# Patient Record
Sex: Male | Born: 1975 | Race: White | Hispanic: No | Marital: Married | Smoking: Current every day smoker
Health system: Southern US, Community
[De-identification: ages and names within clinical notes are randomized; demographics above are authoritative.]

## PROBLEM LIST (undated history)

## (undated) DIAGNOSIS — I519 Heart disease, unspecified: Secondary | ICD-10-CM

## (undated) DIAGNOSIS — E785 Hyperlipidemia, unspecified: Secondary | ICD-10-CM

## (undated) DIAGNOSIS — I10 Essential (primary) hypertension: Secondary | ICD-10-CM

## (undated) HISTORY — DX: Hyperlipidemia, unspecified: E78.5

## (undated) HISTORY — DX: Heart disease, unspecified: I51.9

## (undated) HISTORY — DX: Essential (primary) hypertension: I10

---

## 2018-09-25 HISTORY — PX: CORONARY ARTERY BYPASS GRAFT: SHX141

## 2020-03-26 HISTORY — PX: OTHER SURGICAL HISTORY: SHX169

## 2020-08-20 ENCOUNTER — Ambulatory Visit (INDEPENDENT_AMBULATORY_CARE_PROVIDER_SITE_OTHER): Payer: PRIVATE HEALTH INSURANCE | Admitting: Family Medicine

## 2020-08-20 ENCOUNTER — Other Ambulatory Visit: Payer: Self-pay

## 2020-08-20 ENCOUNTER — Encounter: Payer: Self-pay | Admitting: Family Medicine

## 2020-08-20 VITALS — BP 142/52 | HR 67 | Ht 74.0 in | Wt 240.0 lb

## 2020-08-20 DIAGNOSIS — M51369 Other intervertebral disc degeneration, lumbar region without mention of lumbar back pain or lower extremity pain: Secondary | ICD-10-CM | POA: Insufficient documentation

## 2020-08-20 DIAGNOSIS — G6289 Other specified polyneuropathies: Secondary | ICD-10-CM

## 2020-08-20 DIAGNOSIS — I10 Essential (primary) hypertension: Secondary | ICD-10-CM | POA: Diagnosis not present

## 2020-08-20 DIAGNOSIS — I2510 Atherosclerotic heart disease of native coronary artery without angina pectoris: Secondary | ICD-10-CM

## 2020-08-20 DIAGNOSIS — M109 Gout, unspecified: Secondary | ICD-10-CM | POA: Insufficient documentation

## 2020-08-20 DIAGNOSIS — E782 Mixed hyperlipidemia: Secondary | ICD-10-CM

## 2020-08-20 DIAGNOSIS — M5136 Other intervertebral disc degeneration, lumbar region: Secondary | ICD-10-CM

## 2020-08-20 DIAGNOSIS — I519 Heart disease, unspecified: Secondary | ICD-10-CM | POA: Diagnosis not present

## 2020-08-20 DIAGNOSIS — G8929 Other chronic pain: Secondary | ICD-10-CM

## 2020-08-20 DIAGNOSIS — M1A4710 Other secondary chronic gout, right ankle and foot, without tophus (tophi): Secondary | ICD-10-CM

## 2020-08-20 DIAGNOSIS — M5441 Lumbago with sciatica, right side: Secondary | ICD-10-CM | POA: Diagnosis not present

## 2020-08-20 DIAGNOSIS — E785 Hyperlipidemia, unspecified: Secondary | ICD-10-CM | POA: Insufficient documentation

## 2020-08-20 MED ORDER — LISINOPRIL 20 MG PO TABS: 20.0000 mg | ORAL_TABLET | Freq: Two times a day (BID) | ORAL | 5 refills | Status: AC

## 2020-08-20 MED ORDER — ATORVASTATIN CALCIUM 80 MG PO TABS: 80.0000 mg | ORAL_TABLET | Freq: Every day | ORAL | 5 refills | Status: AC

## 2020-08-20 MED ORDER — ALLOPURINOL 100 MG PO TABS: 100.0000 mg | ORAL_TABLET | Freq: Every day | ORAL | 5 refills | Status: AC

## 2020-08-20 MED ORDER — GABAPENTIN 600 MG PO TABS: ORAL_TABLET | ORAL | 5 refills | Status: AC

## 2020-08-20 NOTE — Patient Instructions (Addendum)
Thank you for coming to the office today.  Metoprolol ______ is it succinate XL 50mg  TWICE a day? Or is it Tartrate 50mg  (non XL) TWICE a day?  We will get records and review so we can discuss the Oxycodone next week.  Refills sent.  [facility] - Neurology Dept [address] [place], [address] [place] Phone: [phone]  They will call and schedule you.   Please schedule a Follow-up Appointment to: Return in about 1 week (around 08/27/2020) for within 1 week for virtual telephone, pain management / med refill.  If you have any other questions or concerns, please feel free to call the office or send a message through MyChart. You may also schedule an earlier appointment if necessary.  Additionally, you may be receiving a survey about your experience at our office within a few days to 1 week by e-mail or mail. We value your feedback.  [phone], DO [facility], Saralyn Pilar

## 2020-08-20 NOTE — Progress Notes (Signed)
Subjective:    Patient ID: Sean Frost, male    DOB: 1975-04-27, 45 y.o.   MRN: [id number]  Sean Frost is a 45 y.o. male presenting on 08/20/2020 for Establish Care  Moved from [place], he has been in various areas in [place]. 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 Usually works 1-2 years on site.  HPI   Needs PCP for medication management  CHRONIC HTN: Reports chronic history of HTN since age 17 on lisinopril. He had nephrotic syndrome as 45 year old was on prednisone, created HTN. Current Meds - Lisinopril 20mg  BID, Metoprolol 50 BID - unsure if tartrate or succinate XL Reports good compliance, took meds today. Tolerating well, w/o complaints. Denies CP, dyspnea, HA, edema, dizziness / lightheadedness  CAD S/p Stent placement, multiple placed, years later Followed by different cardiologist Last followed by [facility] in [facility] - they pursued Heart Cath for pre-op eval for back surgery and they identified significant blockages. He was on med management. He was placed on blood thinners initially, and only temporary course. 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 Last lipid panel within past 6-8 months, HyperTG >1000, unable to calculate the atorvastatin. - Currently taking Atorvastatin 80mg , tolerating well without side effects or myalgias  Chronic Back Pain Neuropathy Lower Extremity R Foot chronic problem. He takes Diclofenac-Misoprostol 75-0.2mg  He has had chronic neuropathy unsure if it is from Sciatica, lumbar spine. He had complicated issue with sciatic nerve. Difficulty with back pain and function Recent surgery in [facility], Neurosurgery, [facility], with spinal surgery with improvement of sciatica Prior to surgery, he has seen Pain Management in [place] , they were doing injections, they were also treating with opiate 5/325mg  not enough pain control.  Next PCP increased to dosage 10/325mg  TID PRN, he has reduced dosage down to 1.5 to 2 pills per day, to help function. 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 Some days it is worse.  Gout, chronic Chronic problem in past, would have some episodic flares bilateral feet, worse R foot can have flare. Has been resolved and controlled on Allopurinol 100mg  daily.  Health Maintenance: Request records  Depression screen [facility] 2/9 08/20/2020  Decreased Interest 0  Down, Depressed, Hopeless 0  PHQ - 2 Score 0  Altered sleeping 3  Tired, decreased energy 0  Change in appetite 0  Feeling bad or failure about yourself  0  Trouble concentrating 0  Moving slowly or fidgety/restless 0  Suicidal thoughts 0  PHQ-9 Score 3  Difficult doing work/chores Not difficult at all   No flowsheet data found.     Past Medical History:  Diagnosis Date  . Heart disease   . Hyperlipidemia   . Hypertension    Past Surgical History:  Procedure Laterality Date  . back surgey  03/2020  . CORONARY ARTERY BYPASS GRAFT  09/2018   Social History   Socioeconomic History  . Marital status: Married    Spouse name: 4/9  . Number of children: 1  . Years of education: Not on file  . Highest education level: Not on file  Occupational History  . Not on file  Tobacco Use  . Smoking status: Current Every Day Smoker    Packs/day: 1.00    Years: 35.00    Pack years: 35.00    Types: Cigarettes  . Smokeless tobacco: Never Used  Vaping Use  . Vaping Use: Never used  Substance and Sexual Activity  . Alcohol use: Yes    Alcohol/week: 6.0 standard drinks    Types: 6 Cans of beer per week  . Drug use: Never  . Sexual activity: Yes  Other Topics Concern  . Not on file  Social History Narrative  . Not on file   Social Determinants of Health   Financial Resource Strain: Not on file  Food Insecurity: Not on file  Transportation Needs: Not on file  Physical Activity: Not on file   Stress: Not on file  Social Connections: Not on file  Intimate Partner Violence: Not on file   Family History  Problem Relation Age of Onset  . Diabetes Mother   . Heart disease Mother   . Heart disease Father    Current Outpatient Medications on File Prior to Visit  Medication Sig  . aspirin EC 81 MG tablet Take 81 mg by mouth daily. Swallow whole.  . Diclofenac-miSOPROStol 75-0.2 MG TBEC Take 1 tablet by mouth in the morning and at bedtime.  Marland Kitchen icosapent Ethyl (VASCEPA) 1 g capsule Take 2 g by mouth 2 (two) times daily.  . metoprolol succinate (TOPROL-XL) 50 MG 24 hr tablet Take 50 mg by mouth in the morning and at bedtime. Take with or immediately following a meal.  . oxyCODONE-acetaminophen (PERCOCET) 10-325 MG tablet Take 0.5-1 tablets by mouth 3 (three) times daily as needed.   No current facility-administered medications on file prior to visit.    Review of Systems Per HPI unless specifically indicated above      Objective:    BP (!) 142/52   Pulse 67   Ht 6\' 2"  (1.88 m)   Wt 240 lb (108.9 kg) Comment: steel toe boots  SpO2 98%   BMI 30.81 kg/m   Wt Readings from Last 3 Encounters:  08/20/20 240 lb (108.9 kg)    Physical Exam Vitals and nursing note reviewed.  Constitutional:      General: He is not in acute distress.    Appearance: He is well-developed. He is not diaphoretic.     Comments: Well-appearing, comfortable, cooperative  HENT:     Head: Normocephalic and atraumatic.  Eyes:     General:        Right eye: No discharge.        Left eye: No discharge.     Conjunctiva/sclera: Conjunctivae normal.  Cardiovascular:     Rate and Rhythm: Normal rate.  Pulmonary:     Effort: Pulmonary effort is normal.  Skin:    General: Skin is warm and dry.     Findings: No erythema or rash.  Neurological:     Mental Status: He is alert and oriented to person, place, and time.  Psychiatric:        Behavior: Behavior normal.     Comments: Well groomed, good eye  contact, normal speech and thoughts    No results found for this or any previous visit.    Assessment & Plan:   Problem List Items Addressed This Visit    Hypertension   Relevant Medications   metoprolol succinate (TOPROL-XL) 50 MG 24 hr tablet   icosapent Ethyl (VASCEPA) 1 g capsule   aspirin EC 81 MG tablet   atorvastatin (LIPITOR) 80 MG tablet   lisinopril (ZESTRIL) 20 MG tablet   Hyperlipidemia   Relevant Medications   metoprolol succinate (TOPROL-XL) 50 MG 24 hr tablet   icosapent Ethyl (VASCEPA) 1  g capsule   aspirin EC 81 MG tablet   atorvastatin (LIPITOR) 80 MG tablet   lisinopril (ZESTRIL) 20 MG tablet   Heart disease   Relevant Medications   metoprolol succinate (TOPROL-XL) 50 MG 24 hr tablet   icosapent Ethyl (VASCEPA) 1 g capsule   aspirin EC 81 MG tablet   atorvastatin (LIPITOR) 80 MG tablet   lisinopril (ZESTRIL) 20 MG tablet   Gout   Relevant Medications   allopurinol (ZYLOPRIM) 100 MG tablet   DDD (degenerative disc disease), lumbar   Relevant Medications   Diclofenac-miSOPROStol 75-0.2 MG TBEC   oxyCODONE-acetaminophen (PERCOCET) 10-325 MG tablet   aspirin EC 81 MG tablet   allopurinol (ZYLOPRIM) 100 MG tablet   gabapentin (NEURONTIN) 600 MG tablet   Coronary artery disease involving native coronary artery of native heart without angina pectoris   Relevant Medications   metoprolol succinate (TOPROL-XL) 50 MG 24 hr tablet   icosapent Ethyl (VASCEPA) 1 g capsule   aspirin EC 81 MG tablet   atorvastatin (LIPITOR) 80 MG tablet   lisinopril (ZESTRIL) 20 MG tablet   Chronic right-sided low back pain with right-sided sciatica - Primary   Relevant Medications   Diclofenac-miSOPROStol 75-0.2 MG TBEC   oxyCODONE-acetaminophen (PERCOCET) 10-325 MG tablet   aspirin EC 81 MG tablet   gabapentin (NEURONTIN) 600 MG tablet    Other Visit Diagnoses    Other polyneuropathy       Relevant Medications   gabapentin (NEURONTIN) 600 MG tablet   Other Relevant Orders    Ambulatory referral to Neurology     #CAD s/p PCI stents #HLD #HTN Previously managed by Cardiology, still followed in [place]. Has had cardiac work up with stent placement and med management. Request prior records from [place] out of state. Refill Atorvastatin, Lisinopril Request clarification on Metoprolol dosing. On ASA 81 Vascepa  #Chronic Low back Pain R Sided Sciatica DDD Request outside records pain management He has had prior spinal surgery Past injections, failed On opiate pain management Previous extensive history of chronic back pain causing limited function due to pain, he is able to work Holiday representative We discussed no controlled rx on initial visit. We will request records and review his case and discuss options. We may be able to offer a dose of oxycodone for PRN use in future for now until can work on other treatment options alternative medications / referrals as indicated.  History of Gout On Allopurinol no flares Refill today  #Neuropathy, foot/ lower ext Suspected secondary to chronic lumbar spine DDD spinal problem No history of DM or other etiology  Referral to Neurology for consultation for chronic worse Right lower extremity R foot, he has complicated history with spinal Lumbar DDD and sciatica, prior surgery in past. He is on high dose gabapentin, has had prior surgery / specialty care from Neurosurgery in [place], he has relocated to this area for work for temporary stay 1-2 years or less.    Orders Placed This Encounter  Procedures  . Ambulatory referral to Neurology    Referral Priority:   Routine    Referral Type:   Consultation    Referral Reason:   Specialty Services Required    Requested Specialty:   Neurology    Number of Visits Requested:   1     Meds ordered this encounter  Medications  . allopurinol (ZYLOPRIM) 100 MG tablet    Sig: Take 1 tablet (100 mg total) by mouth daily.    Dispense:  30 tablet  Refill:  5  . atorvastatin (LIPITOR) 80  MG tablet    Sig: Take 1 tablet (80 mg total) by mouth daily.    Dispense:  30 tablet    Refill:  5  . gabapentin (NEURONTIN) 600 MG tablet    Sig: Take one tab 600 mg in AM, one tab 600 mg at lunch, and then two tabs 1200 mg at PM    Dispense:  120 tablet    Refill:  5  . lisinopril (ZESTRIL) 20 MG tablet    Sig: Take 1 tablet (20 mg total) by mouth in the morning and at bedtime.    Dispense:  60 tablet    Refill:  5      Follow up plan: Return in about 1 week (around 08/27/2020) for within 1 week for virtual telephone, pain management / med refill.  Saralyn PilarAlexander Treniya Lobb, DO [facility] 08/20/2020, 3:32 PM

## 2020-09-08 ENCOUNTER — Ambulatory Visit (INDEPENDENT_AMBULATORY_CARE_PROVIDER_SITE_OTHER): Payer: PRIVATE HEALTH INSURANCE | Admitting: Family Medicine

## 2020-09-08 ENCOUNTER — Other Ambulatory Visit: Payer: Self-pay

## 2020-09-08 ENCOUNTER — Encounter: Payer: Self-pay | Admitting: Family Medicine

## 2020-09-08 VITALS — Ht 74.0 in

## 2020-09-08 DIAGNOSIS — M5441 Lumbago with sciatica, right side: Secondary | ICD-10-CM | POA: Diagnosis not present

## 2020-09-08 DIAGNOSIS — M79671 Pain in right foot: Secondary | ICD-10-CM

## 2020-09-08 DIAGNOSIS — G8929 Other chronic pain: Secondary | ICD-10-CM

## 2020-09-08 DIAGNOSIS — G6289 Other specified polyneuropathies: Secondary | ICD-10-CM | POA: Diagnosis not present

## 2020-09-08 MED ORDER — OXYCODONE-ACETAMINOPHEN 10-325 MG PO TABS: 0.5000 | ORAL_TABLET | Freq: Two times a day (BID) | ORAL | 0 refills | Status: DC | PRN

## 2020-09-08 NOTE — Patient Instructions (Addendum)
   Please schedule a Follow-up Appointment to: Return in about 2 months (around 11/08/2020) for 2 month follow-up AM apt for Med Refill / Labs.  If you have any other questions or concerns, please feel free to call the office or send a message through MyChart. You may also schedule an earlier appointment if necessary.  Additionally, you may be receiving a survey about your experience at our office within a few days to 1 week by e-mail or mail. We value your feedback.  Saralyn Pilar, DO [facility], [place]

## 2020-09-08 NOTE — Progress Notes (Signed)
Subjective:    Patient ID: Sean Frost, male    DOB: 07/16/1975, 45 y.o.   MRN: [id number]  Sean Frost is a 45 y.o. 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  Consent was obtained for remote visit:  Yes.   Answered questions that patient had about telehealth interaction:  Yes.   I discussed the limitations, risks, security and privacy concerns of performing an evaluation and management service by video/telephone. I also discussed with the patient that there may be a patient responsible charge related to this service. The patient expressed understanding and agreed to proceed.  Patient Location: Home Provider Location: [facility] (Office)  Participants in virtual visit: - Patient: Sean Frost  - CMA: Tera Partridge, CMA - Provider: Dr Althea Charon   HPI   Chronic Back Pain Neuropathy Lower Extremity R Foot chronic problem. He takes Diclofenac-Misoprostol 75-0.2mg  He has had chronic neuropathy unsure if it is from Sciatica, lumbar spine. He had complicated issue with sciatic nerve. Difficulty with back pain and function Recent surgery in [facility], Neurosurgery, [facility], with spinal surgery with improvement of sciatica Prior to surgery, he has seen Pain Management in [place] , they were doing injections, they were also treating with opiate 5/325mg  not enough pain control. Next PCP increased to dosage 10/325mg  TID PRN, he has reduced dosage down to 1.5 to 2 pills per day, to help function. 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 Some days it is worse.  Today he reports updates Refilled Gabapentin at last visit, with improvement. He takes Oxycodone-Acetaminophen 10/325 up to 1.5 pills  most days to function, and occasionally takes 1 pill twice a day PRN. Upcoming apt 10/06/20 [facility] Neurology Dr Malvin Johns  Needs new order on Oxycodone.    Depression screen [facility] 2/9 09/08/2020 08/20/2020  Decreased Interest 0 0  Down, Depressed, Hopeless 0 0  PHQ - 2 Score 0 0  Altered sleeping 0 3  Tired, decreased energy 0 0  Change in appetite 0 0  Feeling bad or failure about yourself  0 0  Trouble concentrating 0 0  Moving slowly or fidgety/restless 0 0  Suicidal thoughts 0 0  PHQ-9 Score 0 3  Difficult doing work/chores - Not difficult at all    Social History   Tobacco Use  . Smoking status: Current Every Day Smoker    Packs/day: 1.00    Years: 35.00    Pack years: 35.00    Types: Cigarettes  . Smokeless tobacco: Never Used  Vaping Use  . Vaping Use: Never used  Substance Use Topics  . Alcohol use: Yes    Alcohol/week: 6.0 standard drinks    Types: 6 Cans of beer per week  . Drug use: Never    Review of Systems Per HPI unless specifically indicated above     Objective:    Ht 6\' 2"  (1.88 m)   BMI 30.81 kg/m   Wt Readings from Last 3 Encounters:  08/20/20 240 lb (108.9 kg)    Physical Exam   Not completed. Telephone visit.  No results found for this or any previous visit.    Assessment & Plan:   Problem List Items Addressed This Visit    Chronic right-sided low back pain  with right-sided sciatica - Primary   Relevant Medications   oxyCODONE-acetaminophen (PERCOCET) 10-325 MG tablet    Other Visit Diagnoses    Other polyneuropathy       Relevant Medications   oxyCODONE-acetaminophen (PERCOCET) 10-325 MG tablet   Chronic foot pain, right       Relevant Medications   oxyCODONE-acetaminophen (PERCOCET) 10-325 MG tablet       #Chronic Low back Pain R Sided Sciatica DDD He has had prior spinal surgery Past injections, failed On opiate pain management Previous extensive history of chronic back pain causing limited function due to pain, he is able  to work Holiday representative  History of Gout On Allopurinol no flares  #Neuropathy, foot/ lower ext Suspected secondary to chronic lumbar spine DDD spinal problem No history of DM or other etiology  Reviewed past records from pain management  [place] [facility] 2021 Lumbar Radiculopathy - x3 SI joint injection. R Lumbar RFA no improvement. Nerve blocks. Past Meds Pregabalin 200mg , BID, Tizanidine 4mg  BID, Gabapentin 300 TID, Amitriptyline 25mg  QHS, Duloxetine 60mg  Oxycodone 5/325mg  TID PRN previously  Neurosurgery has recommended Lumbar Laminectomy  Today I agree to re order Oxycodone 10/325mg  0.5 to 1 pill BID PRN for pain #60 pills 0 refill for 1 month  He will f/u with Neuro on 6/13 and discuss further, he may need other specialist  He understands that I cannot do long term pain management, and this is temporary to get him situated with specialist, in future may need refer to Pain Management.  Meds ordered this encounter  Medications  . oxyCODONE-acetaminophen (PERCOCET) 10-325 MG tablet    Sig: Take 0.5-1 tablets by mouth 2 (two) times daily as needed.    Dispense:  60 tablet    Refill:  0      Follow up plan: Return in about 2 months (around 11/08/2020) for 2 month follow-up AM apt for Med Refill / Labs.  Patient verbalizes understanding with the above medical recommendations including the limitation of remote medical advice.  Specific follow-up and call-back criteria were given for patient to follow-up or seek medical care more urgently if needed.  Total duration of direct patient care provided via telephone: 9 minutes   , DO [facility] 09/08/2020, 4:27 PM

## 2020-10-14 ENCOUNTER — Other Ambulatory Visit: Payer: Self-pay

## 2020-10-14 ENCOUNTER — Ambulatory Visit (INDEPENDENT_AMBULATORY_CARE_PROVIDER_SITE_OTHER): Payer: PRIVATE HEALTH INSURANCE | Admitting: Family Medicine

## 2020-10-14 ENCOUNTER — Other Ambulatory Visit: Payer: Self-pay | Admitting: Neurology

## 2020-10-14 ENCOUNTER — Encounter: Payer: Self-pay | Admitting: Family Medicine

## 2020-10-14 VITALS — BP 137/94 | HR 75 | Ht 74.0 in | Wt 243.4 lb

## 2020-10-14 DIAGNOSIS — G8929 Other chronic pain: Secondary | ICD-10-CM

## 2020-10-14 DIAGNOSIS — M5430 Sciatica, unspecified side: Secondary | ICD-10-CM

## 2020-10-14 DIAGNOSIS — M79671 Pain in right foot: Secondary | ICD-10-CM

## 2020-10-14 DIAGNOSIS — M5441 Lumbago with sciatica, right side: Secondary | ICD-10-CM | POA: Diagnosis not present

## 2020-10-14 DIAGNOSIS — G6289 Other specified polyneuropathies: Secondary | ICD-10-CM

## 2020-10-14 MED ORDER — OXYCODONE-ACETAMINOPHEN 10-325 MG PO TABS: 0.5000 | ORAL_TABLET | Freq: Two times a day (BID) | ORAL | 0 refills | Status: DC | PRN

## 2020-10-14 NOTE — Patient Instructions (Addendum)
Thank you for coming to the office today.  Refilled Oxycodone today 60 pills as before  Follow up with Dr Mariah Milling on the MRI imaging and office visit to discuss future treatment plan / goals.  ALso follow with Dr Malvin Johns / nerve study and doctors visit  I can temporarily manage the Oxycodone until you have your MRI and follow up with Dr Mariah Milling, we can discuss after that time based on the treatment plan that she has for you. May need pain management if longer term course of oxycodone.  Please schedule a Follow-up Appointment to: No follow-ups on file.  If you have any other questions or concerns, please feel free to call the office or send a message through MyChart. You may also schedule an earlier appointment if necessary.  Additionally, you may be receiving a survey about your experience at our office within a few days to 1 week by e-mail or mail. We value your feedback.  Saralyn Pilar, DO [facility], [place]

## 2020-10-14 NOTE — Progress Notes (Signed)
Subjective:    Patient ID: Sean Frost, male    DOB: 12-18-1975, 45 y.o.   MRN: [id number]  Sean Frost is a 45 y.o. male presenting on 10/14/2020 for Back Pain   HPI   Chronic Back Pain Neuropathy Lower Extremity R Foot chronic problem. He takes Diclofenac-Misoprostol 75-0.2mg  He has had chronic neuropathy unsure if it is from Sciatica, lumbar spine. He had complicated issue with sciatic nerve. Difficulty with back pain and function History of  surgery in [facility], Neurosurgery, [facility], with spinal surgery with improvement of sciatica Prior to surgery, he has seen Pain Management in [place] , they were doing injections, they were also treating with opiate 5/325mg  not enough pain control. Next PCP increased to dosage 10/325mg  TID PRN, he has reduced dosage down to 1.5 to 2 pills per day, to help function. 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 Some days it is worse.   Today he reports updates  Last seen by me 4/27 then 5/16, referred to Neuro, seen by Dr Malvin Johns at [facility] 10/06/20, he was continued on Gabapentin 600mg  AM / 600 mg afternoon, 1200mg  PM - it helps with pain and RLS  - Planning MRI initially 10/22/20, may reschedule, and will add upper thoracic MRI as well and lumbar MRI as planned.  Continues Gabapentin with improvement He takes Oxycodone-Acetaminophen 10/325 up to 1.5 pills most days to function, and occasionally takes 1 pill twice a day PRN. Needs new order on Oxycodone.  Depression screen [facility] 2/9 10/14/2020 09/08/2020 08/20/2020  Decreased Interest 0 0 0  Down, Depressed, Hopeless 0 0 0  PHQ - 2 Score 0 0 0  Altered sleeping 0 0 3  Tired, decreased energy 0 0 0  Change in appetite 0 0 0  Feeling bad or failure about yourself  0 0 0  Trouble concentrating 0 0 0  Moving slowly or fidgety/restless 0 0 0  Suicidal thoughts 0 0 0  PHQ-9 Score 0 0 3  Difficult doing work/chores Not difficult at all - Not difficult at all     Social History   Tobacco Use   Smoking status: Every Day    Packs/day: 1.00    Years: 35.00    Pack years: 35.00    Types: Cigarettes   Smokeless tobacco: Never  Vaping Use   Vaping Use: Never used  Substance Use Topics   Alcohol use: Yes    Alcohol/week: 6.0 standard drinks    Types: 6 Cans of beer per week   Drug use: Never    Review of Systems Per HPI unless specifically indicated above     Objective:    BP (!) 137/94   Pulse 75   Ht 6\' 2"  (1.88 m)   Wt 243 lb 6.4 oz (110.4 kg)   SpO2 100%   BMI 31.25 kg/m   Wt Readings from Last 3 Encounters:  10/14/20 243 lb 6.4 oz (110.4 kg)  08/20/20 240 lb (108.9 kg)    Physical Exam Vitals and nursing note reviewed.  Constitutional:      General: He is not in acute distress.    Appearance: Normal appearance. He is well-developed. He is not diaphoretic.     Comments: Well-appearing, comfortable, cooperative  HENT:     Head: Normocephalic and atraumatic.  Eyes:     General:        Right eye: No discharge.        Left eye: No discharge.  Conjunctiva/sclera: Conjunctivae normal.  Cardiovascular:     Rate and Rhythm: Normal rate.  Pulmonary:     Effort: Pulmonary effort is normal.  Musculoskeletal:     Comments: Antalgic gait due to pain  Skin:    General: Skin is warm and dry.     Findings: No erythema or rash.  Neurological:     Mental Status: He is alert and oriented to person, place, and time.  Psychiatric:        Mood and Affect: Mood normal.        Behavior: Behavior normal.        Thought Content: Thought content normal.     Comments: Well groomed, good eye contact, normal speech and thoughts     No results found for this or any previous visit.    Assessment & Plan:   Problem List Items Addressed This Visit     Chronic right-sided low back pain with right-sided sciatica - Primary   Relevant Medications   oxyCODONE-acetaminophen (PERCOCET) 10-325 MG tablet   Other Visit Diagnoses      Other polyneuropathy       Relevant Medications   oxyCODONE-acetaminophen (PERCOCET) 10-325 MG tablet   Chronic foot pain, right       Relevant Medications   oxyCODONE-acetaminophen (PERCOCET) 10-325 MG tablet        #Chronic Low back Pain R Sided Sciatica DDD He has had prior spinal surgery Past injections, failed On opiate pain management Previous extensive history of chronic back pain causing limited function due to pain, 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  Today I agree to re order Oxycodone 10/325mg  0.5 to 1 pill BID PRN for pain #60 pills 0 refill for 1 more month   He will f/u with both Neurology and PM&R for imaging and testing and discuss MRI with them for further treatment plan.  He understands that I cannot do long term pain management, and this is temporary to get him situated with specialist, in future may need refer to Pain Management.  Once they have come up with their plan if they cannot manage his oxycodone and he needs longer term medicine we can refer to Pain Management if indicated.   Meds ordered this encounter  Medications   oxyCODONE-acetaminophen (PERCOCET) 10-325 MG tablet    Sig: Take 0.5-1 tablets by mouth 2 (two) times daily as needed.    Dispense:  60 tablet    Refill:  0     Follow up plan: Return in about 4 weeks (around 11/11/2020), or if symptoms worsen or fail to improve, for 4 weeks as needed for back pain / med refill.    Saralyn Pilar, DO [facility] 10/14/2020, 4:17 PM

## 2020-10-22 ENCOUNTER — Ambulatory Visit: Payer: PRIVATE HEALTH INSURANCE

## 2020-10-25 ENCOUNTER — Ambulatory Visit
Admission: RE | Admit: 2020-10-25 | Discharge: 2020-10-25 | Disposition: A | Discharge status: Home / Self Care | Payer: PRIVATE HEALTH INSURANCE | Source: Ambulatory Visit | Attending: Neurology | Admitting: Neurology

## 2020-10-25 DIAGNOSIS — M5430 Sciatica, unspecified side: Secondary | ICD-10-CM | POA: Diagnosis present

## 2020-10-25 DIAGNOSIS — M549 Dorsalgia, unspecified: Secondary | ICD-10-CM | POA: Diagnosis present

## 2020-11-14 ENCOUNTER — Encounter: Payer: Self-pay | Admitting: Family Medicine

## 2020-11-14 ENCOUNTER — Other Ambulatory Visit: Payer: Self-pay

## 2020-11-14 ENCOUNTER — Ambulatory Visit (INDEPENDENT_AMBULATORY_CARE_PROVIDER_SITE_OTHER): Payer: PRIVATE HEALTH INSURANCE | Admitting: Family Medicine

## 2020-11-14 DIAGNOSIS — M79671 Pain in right foot: Secondary | ICD-10-CM | POA: Diagnosis not present

## 2020-11-14 DIAGNOSIS — G8929 Other chronic pain: Secondary | ICD-10-CM

## 2020-11-14 DIAGNOSIS — M5441 Lumbago with sciatica, right side: Secondary | ICD-10-CM | POA: Diagnosis not present

## 2020-11-14 DIAGNOSIS — G6289 Other specified polyneuropathies: Secondary | ICD-10-CM | POA: Diagnosis not present

## 2020-11-14 MED ORDER — OXYCODONE-ACETAMINOPHEN 10-325 MG PO TABS: 0.5000 | ORAL_TABLET | Freq: Two times a day (BID) | ORAL | 0 refills | Status: DC | PRN

## 2020-11-14 NOTE — Patient Instructions (Addendum)
Thank you for coming to the office today.  Try the injection w/ Dr Mariah Milling on Monday first see how you do, and contact me back when if ready for refer to Pain Doctor, prefer [place] option if possible.  Refilled 60 pill or 1 month supply for now, contact in future for this if need  Pain Management referral - for medications / injections at this time, in future we could reconsider other spinal specialists.  Check into these options  [facility] Address: [address] [place], [place] [address] Phone: [phone]  Dr Ronita Hipps [facility] [address] [place], [place] [address] Ph: [phone]  [place], [place] [facility] [address] [place], [place] [address] Phone: [phone] - open til 6pm not on weekends.  Look into other [facility] locations - different hours.  CHRONIC PAIN MANAGEMENT  [facility]ne Sharper Ph: [phone]  [facility] Mast  [facility] [address] [place] [address] [facility] Second Floor [place], [place]  [address] Appointments: [phone]    Please schedule a Follow-up Appointment to: Return in about 4 weeks (around 12/12/2020), or if symptoms worsen or fail to improve.  If you have any other questions or concerns, please feel free to call the office or send a message through MyChart. You may also schedule an earlier appointment if necessary.  Additionally, you may be receiving a survey about your experience at our office within a few days to 1 week by e-mail or mail. We value your feedback.  Saralyn Pilar, DO [facility], [place]

## 2020-11-14 NOTE — Progress Notes (Signed)
Subjective:    Patient ID: Sean Frost, male    DOB: April 01, 1976, 45 y.o.   MRN: [id number]  Sean Frost is a 45 y.o. male presenting on 11/14/2020 for Back Pain   HPI   Chronic Back Pain Neuropathy Lower Extremity R Foot chronic problem. He takes Diclofenac-Misoprostol 75-0.2mg  He has had chronic neuropathy unsure if it is from Sciatica, lumbar spine. He had complicated issue with sciatic nerve. Difficulty with back pain and function History of  surgery in [facility], Neurosurgery, [facility], with spinal surgery with improvement of sciatica Prior to surgery, he has seen Pain Management in [place] , they were doing injections, they were also treating with opiate 5/325mg  not enough pain control. Next PCP increased to dosage 10/325mg  TID PRN, he has reduced dosage down to 1.5 to 2 pills per day, to help function. 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 Some days it is worse.   Today he reports updates  Seen by Dr Mariah Milling [facility] on 11/11/20. She reviewed MRI images, and she is offering ESI R T7-8 to target disc herniation., also consider other targets and possibly Neurosurgery referral if indicated.  Followed by Neuro. On Gabapentin currently for pain and RLS.   Continues Gabapentin with improvement He takes Oxycodone-Acetaminophen 10/325 up to 1.5 pills most days to function, and occasionally takes 1 pill twice a day PRN. Needs new order on Oxycodone.   Depression screen [facility] 2/9 11/14/2020 10/14/2020 09/08/2020  Decreased Interest 0 0 0  Down, Depressed, Hopeless 0 0 0  PHQ - 2 Score 0 0 0  Altered sleeping 0 0 0  Tired, decreased energy 0 0 0  Change in appetite 0 0 0  Feeling bad or failure about yourself  0 0 0  Trouble concentrating 0 0 0  Moving slowly or fidgety/restless 3 0 0  Suicidal thoughts 0 0 0  PHQ-9 Score 3 0 0  Difficult doing work/chores Very difficult Not difficult at all -    Social History   Tobacco Use   Smoking  status: Every Day    Packs/day: 1.00    Years: 35.00    Pack years: 35.00    Types: Cigarettes   Smokeless tobacco: Never  Vaping Use   Vaping Use: Never used  Substance Use Topics   Alcohol use: Yes    Alcohol/week: 6.0 standard drinks    Types: 6 Cans of beer per week   Drug use: Never    Review of Systems Per HPI unless specifically indicated above     Objective:    BP (!) 137/94 (BP Location: Left Arm, Cuff Size: Normal)   Pulse 73   Ht 6\' 2"  (1.88 m)   Wt 244 lb 12.8 oz (111 kg)   BMI 31.43 kg/m   Wt Readings from Last 3 Encounters:  11/14/20 244 lb 12.8 oz (111 kg)  10/14/20 243 lb 6.4 oz (110.4 kg)  08/20/20 240 lb (108.9 kg)    Physical Exam Vitals and nursing note reviewed.  Constitutional:      General: He is not in acute distress.    Appearance: Normal appearance. He is well-developed. He is not diaphoretic.     Comments: Well-appearing, comfortable, cooperative  HENT:     Head: Normocephalic and atraumatic.  Eyes:     General:        Right eye: No discharge.        Left eye: No discharge.  Conjunctiva/sclera: Conjunctivae normal.  Cardiovascular:     Rate and Rhythm: Normal rate.  Pulmonary:     Effort: Pulmonary effort is normal.  Musculoskeletal:     Comments: Antalgic gait due to pain  Skin:    General: Skin is warm and dry.     Findings: No erythema or rash.  Neurological:     Mental Status: He is alert and oriented to person, place, and time.  Psychiatric:        Mood and Affect: Mood normal.        Behavior: Behavior normal.        Thought Content: Thought content normal.     Comments: Well groomed, good eye contact, normal speech and thoughts    ---------------------------------------------------------------------------------  I have personally reviewed the radiology report from 10/28/20 MRI.  CLINICAL DATA:  Back pain over the last 3 years. Lumbar laminectomy 7 months ago. Weakness.   EXAM: MRI THORACIC AND LUMBAR SPINE  WITHOUT CONTRAST   TECHNIQUE: Multiplanar and multiecho pulse sequences of the thoracic and lumbar spine were obtained without intravenous contrast.   COMPARISON:  None.   FINDINGS: MRI THORACIC SPINE FINDINGS   Alignment:  No vertebral subluxation is observed.   Vertebrae: Disc desiccation at T7-8, T8-9, and T9-10. No significant vertebral marrow edema is identified. Mild degenerative endplate findings at T8-9 and T9-10.   Cord: Left anterior cord is indented at the T7-8 level due to degenerative disc disease, but without abnormal internal cord signal.   Paraspinal and other soft tissues: Prior median sternotomy.   Disc levels:   C7-T1: No impingement.  Right paracentral disc protrusion.   T1-2: Unremarkable.   T2-3: Unremarkable.   T3-4: No impingement.  Small left lateral recess disc protrusion.   T4-5: No impingement.  Small left lateral recess disc protrusion.   T5-6: Unremarkable.   T6-7: Unremarkable.   T7-8: Moderate left eccentric central narrowing of the thecal sac due to a large focal disc protrusion. This indents the left anterior cord although there is still some fluid CSF space posterior to the cord. No abnormal cord signal. The disc protrusion extends mildly cephalad.   T8-9: Mild central narrowing of the thecal sac due to a slightly left paracentral disc protrusion.   T9-10: Mild central narrowing of the thecal sac due to a central disc protrusion.   T10-11: Unremarkable.   T11-12: Unremarkable.   T12-L1: Unremarkable.   MRI LUMBAR SPINE FINDINGS   Segmentation: The lowest lumbar type non-rib-bearing vertebra is labeled as L5.   Alignment:  No vertebral subluxation is observed.   Vertebrae: Prior posterior decompression at L4-5 at L5-S1. There is some mild prominence the anterior epidural space at L5 and S1 with stranding and mildly accentuated T2 signal in this epidural tissue possibly a manifestation of mild fibrosis or mildly  prominent lymphatics.   Conus medullaris and cauda equina: Conus extends to the L1 level. Conus and cauda equina appear normal.   Paraspinal and other soft tissues: Unremarkable   Disc levels:   L1-2: Unremarkable.   L2-3: No impingement.  Disc bulge with small central annular tear.   L3-4: Mild central narrowing of the thecal sac due to central disc protrusion superimposed on disc bulge. The disc bulge abuts but does not significantly displace the L3 nerves in the lateral extraforaminal space.   L4-5: Borderline left subarticular lateral recess stenosis related to a small left paracentral disc protrusion. Prior posterior decompression.   L5-S1: Borderline right foraminal stenosis due to facet  spurring. Small central disc protrusion. Posterior decompression.   IMPRESSION: MR THORACIC SPINE IMPRESSION   1. Disc protrusions cause moderate impingement at T7-8 (with disc protrusion indenting the left anterior cord) and mild impingement at T8-9 and T9-10.   MR LUMBAR SPINE IMPRESSION   1. Mild central narrowing of the thecal sac at L3-4 due to disc protrusion superimposed on disc bulge. 2. Only borderline levels of impingement remaining at the L4-5 and L5-S1 levels where there has been posterior decompression. Slight prominence of the anterior epidural adipose tissues at the L5 and S1 levels with stranding in the epidural adipose tissues which may be from fibrosis or dilated lymphatics, but not causing impingement.     Electronically Signed   By: Gaylyn Rong M.D.   On: 10/28/2020 09:09   No results found for this or any previous visit.    Assessment & Plan:   Problem List Items Addressed This Visit     Chronic right-sided low back pain with right-sided sciatica   Relevant Medications   oxyCODONE-acetaminophen (PERCOCET) 10-325 MG tablet   Other Visit Diagnoses     Other polyneuropathy       Relevant Medications   oxyCODONE-acetaminophen (PERCOCET)  10-325 MG tablet   Chronic foot pain, right       Relevant Medications   oxyCODONE-acetaminophen (PERCOCET) 10-325 MG tablet       #Chronic Low back Pain R Sided Sciatica DDD He has had prior spinal surgery Past injections, failed On opiate pain management Previous extensive history of chronic back pain causing limited function due to pain, 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  He will pursue upcoming ESI Thoracic T7 injection on Monday next week. 7/25 with Dr Mariah Milling then follow up if need other inj or future referral to Neurosurgery.   Today I agree to re order Oxycodone 10/325mg  0.5 to 1 pill BID PRN for pain #60 pills 0 refill for 1 more month   He understands that I cannot do long term pain management, and this is temporary to get him situated with specialist, in future may need refer to Pain Management.  If he does not benefit from [facility] series or does not pursue neurosurgery or needs longer term opiate management, I would refer to Pain Management, he agrees and will check into Pain Management clinics as well look for one that is taking apt at or after 4pm and is within distance for him to go to if this is what he decides.   Meds ordered this encounter  Medications   oxyCODONE-acetaminophen (PERCOCET) 10-325 MG tablet    Sig: Take 0.5-1 tablets by mouth 2 (two) times daily as needed.    Dispense:  60 tablet    Refill:  0     Follow up plan: Return in about 4 weeks (around 12/12/2020), or if symptoms worsen or fail to improve.   Saralyn Pilar, DO [facility] 11/14/2020, 9:29 AM

## 2021-01-02 ENCOUNTER — Telehealth: Payer: Self-pay | Admitting: Family Medicine

## 2021-01-02 ENCOUNTER — Telehealth: Payer: Self-pay

## 2021-01-02 DIAGNOSIS — M5441 Lumbago with sciatica, right side: Secondary | ICD-10-CM

## 2021-01-02 DIAGNOSIS — G8929 Other chronic pain: Secondary | ICD-10-CM

## 2021-01-02 DIAGNOSIS — G6289 Other specified polyneuropathies: Secondary | ICD-10-CM

## 2021-01-02 MED ORDER — OXYCODONE-ACETAMINOPHEN 10-325 MG PO TABS: 0.5000 | ORAL_TABLET | Freq: Two times a day (BID) | ORAL | 0 refills | Status: AC | PRN

## 2021-01-02 NOTE — Telephone Encounter (Signed)
Pt is calling to cancel the request be transferred. She will pick up the script at [facility].  Pt wife is stating that the [facility] is go to fill the script despite state out of stock online.

## 2021-01-02 NOTE — Telephone Encounter (Signed)
Pt  spouse at the window requesting refill on pt pain meds until he can get into pain clinic. Pt have a virtual appt 9/19  @ 4:00

## 2021-01-02 NOTE — Telephone Encounter (Signed)
Pt's spouse called in for assistance. [facility] that rx was sent to is out of stock of medication. Pt is requesting to have Rx sent to instead:   [facility]: [address]   Phone: [phone]  Please assist pt further.

## 2021-01-02 NOTE — Addendum Note (Signed)
Addended by: Smitty Cords on: 01/02/2021 11:40 AM   Modules accepted: Orders

## 2021-01-02 NOTE — Telephone Encounter (Signed)
As discussed at last visit we are bridging him on Oxycodone for now as he follows with Neurology and PM&R speciality, we will work to get him into pain management if needed, for now will agree to order refill on Oxycodone, reviewed his PDMP last fills. See him at upcoming virtual visit  Saralyn Pilar, DO [facility] 01/02/2021, 11:40 AM

## 2021-01-12 ENCOUNTER — Encounter: Payer: Self-pay | Admitting: Family Medicine

## 2021-01-12 ENCOUNTER — Ambulatory Visit (INDEPENDENT_AMBULATORY_CARE_PROVIDER_SITE_OTHER): Payer: PRIVATE HEALTH INSURANCE | Admitting: Family Medicine

## 2021-01-12 ENCOUNTER — Other Ambulatory Visit: Payer: Self-pay

## 2021-01-12 VITALS — Ht 74.0 in | Wt 244.0 lb

## 2021-01-12 DIAGNOSIS — M5441 Lumbago with sciatica, right side: Secondary | ICD-10-CM

## 2021-01-12 DIAGNOSIS — G8929 Other chronic pain: Secondary | ICD-10-CM

## 2021-01-12 DIAGNOSIS — G6289 Other specified polyneuropathies: Secondary | ICD-10-CM | POA: Diagnosis not present

## 2021-01-12 DIAGNOSIS — M5136 Other intervertebral disc degeneration, lumbar region: Secondary | ICD-10-CM | POA: Diagnosis not present

## 2021-01-12 NOTE — Patient Instructions (Signed)
° °  Please schedule a Follow-up Appointment to: No follow-ups on file. ° °If you have any other questions or concerns, please feel free to call the office or send a message through MyChart. You may also schedule an earlier appointment if necessary. ° °Additionally, you may be receiving a survey about your experience at our office within a few days to 1 week by e-mail or mail. We value your feedback. ° °Dallen Bunte, DO °[facility] °

## 2021-01-12 NOTE — Progress Notes (Signed)
Virtual Visit via Telephone The purpose of this virtual visit is to provide medical care while limiting exposure to the novel coronavirus (COVID19) for both patient and office staff.  Consent was obtained for phone visit:  Yes.   Answered questions that patient had about telehealth interaction:  Yes.   I discussed the limitations, risks, security and privacy concerns of performing an evaluation and management service by telephone. I also discussed with the patient that there may be a patient responsible charge related to this service. The patient expressed understanding and agreed to proceed.  Patient Location: Home Provider Location: [facility] (Office)  Participants in virtual visit: - Patient: Sean Frost - CMA: Burnell Blanks, CMA - Provider: Dr Althea Charon  ---------------------------------------------------------------------- Chief Complaint  Patient presents with   Back Pain    S: Reviewed CMA documentation. 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 He takes Diclofenac-Misoprostol 75-0.2mg  He has had chronic neuropathy unsure if it is from Sciatica, lumbar spine. He had complicated issue with sciatic nerve. Difficulty with back pain and function History of  surgery in [facility], Neurosurgery, [facility], with spinal surgery with improvement of sciatica Prior to surgery, he has seen Pain Management in [place] , they were doing injections, they were also treating with opiate 5/325mg  not enough pain control. Next PCP increased to dosage 10/325mg  TID PRN, he has reduced dosage down to 1.5 to 2 pills per day, to help function. 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 Some days it is worse.   Today he reports updates  Update after Lumbar ESI 11/11/20 by Dr Mariah Milling - improvement for several days, but then pain returned, will need repeat ESI  in series. He was referred to Dr Malvin [facility]. Has seen them last on 12/08/20, continues on Gabapentin dose increase, continue w/ Dr Mariah Milling for injections and refilled Gabapentin, consider future Nortriptyline, Lyrica  He was referred to pain management. He saw [facility] in [place] then next apt on 10/3 in [place], they may discuss pain medication management going forward.    Continues Gabapentin with improvement He takes Oxycodone-Acetaminophen 10/325 up to 1.5 pills most days to function, and occasionally takes 1 pill twice a day PRN. Last filled oxycodone on 01/02/21  Denies any known or suspected exposure to person with or possibly with COVID19.  Denies any fevers, chills, sweats, body ache, cough, shortness of breath, sinus pain or pressure, headache, abdominal pain, diarrhea  Past Medical History:  Diagnosis Date   Heart disease    Hyperlipidemia    Hypertension    Social History   Tobacco Use   Smoking status: Every Day    Packs/day: 1.00    Years: 35.00    Pack years: 35.00    Types: Cigarettes   Smokeless tobacco: Never  Vaping Use   Vaping Use: Never used  Substance Use Topics   Alcohol use: Yes    Alcohol/week: 6.0 standard drinks    Types: 6 Cans of beer per week   Drug use: Never    Current Outpatient Medications:    allopurinol (ZYLOPRIM) 100 MG tablet, Take 1 tablet (100 mg total) by mouth daily., Disp: 30 tablet, Rfl: 5   aspirin EC 81 MG tablet, Take 81 mg by mouth daily. Swallow whole., Disp: , Rfl:    atorvastatin (LIPITOR) 80 MG tablet, Take 1 tablet (80 mg total)  by mouth daily., Disp: 30 tablet, Rfl: 5   Diclofenac-miSOPROStol 75-0.2 MG TBEC, Take 1 tablet by mouth in the morning and at bedtime., Disp: , Rfl:    gabapentin (NEURONTIN) 600 MG tablet, Take one tab 600 mg in AM, one tab 600 mg at lunch, and then two tabs 1200 mg at PM, Disp: 120 tablet, Rfl: 5   icosapent Ethyl (VASCEPA) 1 g capsule, Take 2 g by mouth 2 (two) times daily.,  Disp: , Rfl:    lisinopril (ZESTRIL) 20 MG tablet, Take 1 tablet (20 mg total) by mouth in the morning and at bedtime., Disp: 60 tablet, Rfl: 5   lisinopril (ZESTRIL) 20 MG tablet, Take by mouth., Disp: , Rfl:    metoprolol succinate (TOPROL-XL) 50 MG 24 hr tablet, Take 50 mg by mouth in the morning and at bedtime. Take with or immediately following a meal., Disp: , Rfl:    metoprolol tartrate (LOPRESSOR) 50 MG tablet, Take 50 mg by mouth 2 (two) times daily., Disp: , Rfl:    oxyCODONE-acetaminophen (PERCOCET) 10-325 MG tablet, Take 0.5-1 tablets by mouth 2 (two) times daily as needed., Disp: 60 tablet, Rfl: 0  Depression screen [facility] 2/9 11/14/2020 10/14/2020 09/08/2020  Decreased Interest 0 0 0  Down, Depressed, Hopeless 0 0 0  PHQ - 2 Score 0 0 0  Altered sleeping 0 0 0  Tired, decreased energy 0 0 0  Change in appetite 0 0 0  Feeling bad or failure about yourself  0 0 0  Trouble concentrating 0 0 0  Moving slowly or fidgety/restless 3 0 0  Suicidal thoughts 0 0 0  PHQ-9 Score 3 0 0  Difficult doing work/chores Very difficult Not difficult at all -    GAD 7 : Generalized Anxiety Score 11/14/2020 10/14/2020 09/08/2020  Nervous, Anxious, on Edge 0 0 0  Control/stop worrying 0 0 0  Worry too much - different things 0 0 0  Trouble relaxing 3 0 0  Restless 0 0 0  Easily annoyed or irritable 0 0 0  Afraid - awful might happen 0 0 0  Total GAD 7 Score 3 0 0  Anxiety Difficulty Somewhat difficult Not difficult at all -    -------------------------------------------------------------------------- O: No physical exam performed due to remote telephone encounter.  Lab results reviewed.  No results found for this or any previous visit (from the past 2160 hour(s)).  -------------------------------------------------------------------------- A&P:  Problem List Items Addressed This Visit     DDD (degenerative disc disease), lumbar   Chronic right-sided low back pain with right-sided sciatica  - Primary   Other Visit Diagnoses     Other polyneuropathy          #Chronic Low back Pain R Sided Sciatica DDD He has had prior spinal surgery Past injections, failed On opiate pain management Previous extensive history of chronic back pain causing limited function due to pain, he is able to work [employer]   [facility]ed by [facility]   Completed MRI Thoracic and Lumbar Spine   Reviewed PDMP. Last fill Oxycodone 01/02/21.   His specialists have not agreed to manage his Oxycodone at this time.   He will pursue upcoming Pain Management apt with [facility] in [place] on 01/26/21, he has done x 1 ESI with Dr Mariah Milling has repeat in future anticipated / Neurology follow up   Today I agree to continue Oxycodone 10/325mg  0.5 to 1 pill BID PRN for pain #60 pills 0 refill  for 1 more month - he has existing rx from 01/02/21 that I authorized, and today reviewed this further. Will return in 3-4 weeks and can renew rx if needed otherwise, he may be managed on opiate pain med by [facility] in future.   He understands that I cannot do long term pain management, and this is temporary to get him situated with specialist    No orders of the defined types were placed in this encounter.   Follow-up: - Return in 3-4 weeks follow-up med refill  Patient verbalizes understanding with the above medical recommendations including the limitation of remote medical advice.  Specific follow-up and call-back criteria were given for patient to follow-up or seek medical care more urgently if needed.   - Time spent in direct consultation with patient on phone: 8 minutes   Saralyn Pilar, DO [facility] 01/12/2021, 4:36 PM

## 2021-03-20 NOTE — Telephone Encounter (Signed)
Pt calling stating that he still has not had his medical records regarding why he needs the hydrocodone faxed.   [phone] -pt stated not sure if this is a fax number or call back number.   Dr. Jenel Lucks [facility]

## 2021-05-12 ENCOUNTER — Other Ambulatory Visit: Payer: Self-pay | Admitting: Family Medicine

## 2021-05-12 DIAGNOSIS — E782 Mixed hyperlipidemia: Secondary | ICD-10-CM

## 2021-05-12 DIAGNOSIS — I10 Essential (primary) hypertension: Secondary | ICD-10-CM

## 2021-05-12 DIAGNOSIS — M1A4710 Other secondary chronic gout, right ankle and foot, without tophus (tophi): Secondary | ICD-10-CM

## 2021-05-12 NOTE — Telephone Encounter (Signed)
Requested medications are due for refill today.  yes  Requested medications are on the active medications list.  yes  Last refill. All 3 filled on 08/20/2020  Future visit scheduled.   no  Notes to clinic.  Failed protocol due to missing labs.    Requested Prescriptions  Pending Prescriptions Disp Refills   lisinopril (ZESTRIL) 20 MG tablet [Pharmacy Med Name: Lisinopril 20 MG Oral Tablet] 60 tablet 0    Sig: TAKE 1 TABLET BY MOUTH IN THE MORNING AND 1 TABLET AT BEDTIME     Cardiovascular:  ACE Inhibitors Failed - 05/12/2021  2:35 AM      Failed - Cr in normal range and within 180 days    No results found for: CREATININE, LABCREAU, LABCREA, POCCRE        Failed - K in normal range and within 180 days    No results found for: K, POTASSIUM, POCK        Failed - Last BP in normal range    BP Readings from Last 1 Encounters:  11/14/20 (!) 137/94          Passed - Patient is not pregnant      Passed - Valid encounter within last 6 months    Recent Outpatient Visits           4 months ago Chronic right-sided low back pain with right-sided sciatica   [place], DO   5 months ago Chronic right-sided low back pain with right-sided sciatica   [place], DO   7 months ago Chronic right-sided low back pain with right-sided sciatica   [place], DO   8 months ago Chronic right-sided low back pain with right-sided sciatica   [place], DO   8 months ago Chronic right-sided low back pain with right-sided sciatica   [place], DO               allopurinol (ZYLOPRIM) 100 MG tablet [Pharmacy Med Name: Allopurinol 100 MG Oral Tablet] 30 tablet 0    Sig: Take 1 tablet by mouth once daily     Endocrinology:  Gout Agents Failed - 05/12/2021  2:35 AM      Failed - Uric  Acid in normal range and within 360 days    No results found for: POCURA, LABURIC        Failed - Cr in normal range and within 360 days    No results found for: CREATININE, LABCREAU, LABCREA, POCCRE        Passed - Valid encounter within last 12 months    Recent Outpatient Visits           4 months ago Chronic right-sided low back pain with right-sided sciatica   [place], DO   5 months ago Chronic right-sided low back pain with right-sided sciatica   [place], DO   7 months ago Chronic right-sided low back pain with right-sided sciatica   [place], DO   8 months ago Chronic right-sided low back pain with right-sided sciatica   [place], DO   8 months ago Chronic right-sided low back pain with right-sided sciatica   [place], Devonne Doughty, DO  atorvastatin (LIPITOR) 80 MG tablet [Pharmacy Med Name: Atorvastatin Calcium 80 MG Oral Tablet] 30 tablet 0    Sig: Take 1 tablet by mouth once daily     Cardiovascular:  Antilipid - Statins Failed - 05/12/2021  2:35 AM      Failed - Total Cholesterol in normal range and within 360 days    No results found for: CHOL, POCCHOL, CHOLTOT        Failed - LDL in normal range and within 360 days    No results found for: LDLCALC, LDLC, HIRISKLDL, POCLDL, LDLDIRECT, REALLDLC, TOTLDLC        Failed - HDL in normal range and within 360 days    No results found for: HDL, POCHDL        Failed - Triglycerides in normal range and within 360 days    No results found for: TRIG, POCTRIG        Passed - Patient is not pregnant      Passed - Valid encounter within last 12 months    Recent Outpatient Visits           4 months ago Chronic right-sided low back pain with right-sided sciatica   [place], DO   5 months ago Chronic right-sided low back pain with right-sided sciatica   [place], DO   7 months ago Chronic right-sided low back pain with right-sided sciatica   [place], DO   8 months ago Chronic right-sided low back pain with right-sided sciatica   [place], DO   8 months ago Chronic right-sided low back pain with right-sided sciatica   [place], Devonne Doughty, DO

## 2021-06-11 ENCOUNTER — Other Ambulatory Visit: Payer: Self-pay | Admitting: Family Medicine

## 2021-06-11 DIAGNOSIS — M1A4710 Other secondary chronic gout, right ankle and foot, without tophus (tophi): Secondary | ICD-10-CM

## 2021-06-11 DIAGNOSIS — E782 Mixed hyperlipidemia: Secondary | ICD-10-CM

## 2021-06-11 DIAGNOSIS — I10 Essential (primary) hypertension: Secondary | ICD-10-CM

## 2021-06-11 NOTE — Telephone Encounter (Signed)
Requested medications are due for refill today.  Yes - all 3  Requested medications are on the active medications list.  Yes - all 3  Last refill. All 3 refilled 08/20/2020 with a 6 month supply  Future visit scheduled.   no  Notes to clinic.  Refills failed protocol d/t missing labs.    Requested Prescriptions  Pending Prescriptions Disp Refills   lisinopril (ZESTRIL) 20 MG tablet [Pharmacy Med Name: Lisinopril 20 MG Oral Tablet] 60 tablet 0    Sig: TAKE 1 TABLET BY MOUTH IN THE MORNING AND 1 TABLET AT BEDTIME     Cardiovascular:  ACE Inhibitors Failed - 06/11/2021  2:21 AM      Failed - Cr in normal range and within 180 days    No results found for: CREATININE, LABCREAU, LABCREA, POCCRE        Failed - K in normal range and within 180 days    No results found for: K, POTASSIUM, POCK        Failed - Last BP in normal range    BP Readings from Last 1 Encounters:  11/14/20 (!) 137/94          Failed - Valid encounter within last 6 months    Recent Outpatient Visits           5 months ago Chronic right-sided low back pain with right-sided sciatica   [facility] Smitty Cords, DO   6 months ago Chronic right-sided low back pain with right-sided sciatica   [facility] [place], Netta Neat, DO   8 months ago Chronic right-sided low back pain with right-sided sciatica   [facility] Smitty Cords, DO   9 months ago Chronic right-sided low back pain with right-sided sciatica   [facility] Smitty Cords, DO   9 months ago Chronic right-sided low back pain with right-sided sciatica   [facility] [place], Netta Neat, DO              Passed - Patient is not pregnant       allopurinol (ZYLOPRIM) 100 MG tablet [Pharmacy Med Name: Allopurinol 100 MG Oral Tablet] 30 tablet 0    Sig: Take 1 tablet by mouth once daily     Endocrinology:  Gout Agents -  allopurinol Failed - 06/11/2021  2:21 AM      Failed - Uric Acid in normal range and within 360 days    No results found for: POCURA, LABURIC        Failed - Cr in normal range and within 360 days    No results found for: CREATININE, LABCREAU, LABCREA, POCCRE        Failed - CBC within normal limits and completed in the last 12 months    No results found for: WBC, WBCKUC No results found for: RBC, RBCKUC No results found for: HGB, HGBKUC, HGBPOCKUC, HGBOTHER, TOTHGB, HGBPLASMA, LABHEMOF No results found for: HCT, HCTKUC, SRHCT No results found for: MCHC, MCHCKUC No results found for: MCH, MCHKUC No results found for: MCVKUC, MCV No results found for: PLTCOUNTKUC, LABPLAT, POCPLA No results found for: RDW, RDWKUC, POCRDW       Passed - Valid encounter within last 12 months    Recent Outpatient Visits           5 months ago Chronic right-sided low back pain with right-sided sciatica   [facility] Smitty Cords, DO  6 months ago Chronic right-sided low back pain with right-sided sciatica   [facility] [place], Netta Neat, DO   8 months ago Chronic right-sided low back pain with right-sided sciatica   [facility] Smitty Cords, DO   9 months ago Chronic right-sided low back pain with right-sided sciatica   [facility] Smitty Cords, DO   9 months ago Chronic right-sided low back pain with right-sided sciatica   [facility], Netta Neat, DO               atorvastatin (LIPITOR) 80 MG tablet [Pharmacy Med Name: Atorvastatin Calcium 80 MG Oral Tablet] 30 tablet 0    Sig: Take 1 tablet by mouth once daily     Cardiovascular:  Antilipid - Statins Failed - 06/11/2021  2:21 AM      Failed - Lipid Panel in normal range within the last 12 months    No results found for: CHOL, POCCHOL, CHOLTOT No results found for: LDLCALC, LDLC, HIRISKLDL, POCLDL,  LDLDIRECT, REALLDLC, TOTLDLC No results found for: HDL, POCHDL No results found for: TRIG, POCTRIG       Passed - Patient is not pregnant      Passed - Valid encounter within last 12 months    Recent Outpatient Visits           5 months ago Chronic right-sided low back pain with right-sided sciatica   [facility] [place], Netta Neat, DO   6 months ago Chronic right-sided low back pain with right-sided sciatica   [facility] [place], Netta Neat, DO   8 months ago Chronic right-sided low back pain with right-sided sciatica   [facility] Smitty Cords, DO   9 months ago Chronic right-sided low back pain with right-sided sciatica   [facility] Smitty Cords, DO   9 months ago Chronic right-sided low back pain with right-sided sciatica   [facility] [place], Netta Neat, DO

## 2021-12-11 IMAGING — MR MR LUMBAR SPINE W/O CM
8 of 11 series · 34 of 48 positions shown · non-contrast
Comparison: None.

CLINICAL DATA: Back pain over the last 3 years. Lumbar laminectomy
7 months ago. Weakness.

EXAM:
MRI THORACIC AND LUMBAR SPINE WITHOUT CONTRAST
TECHNIQUE: Multiplanar and multiecho pulse sequences of the thoracic and lumbar
spine were obtained without intravenous contrast.

[Series 18: T1 · sagittal · 6.0mm · 1.41mm/px · 2 of 9 slices shown (1 of 4)]
[im 1/9]
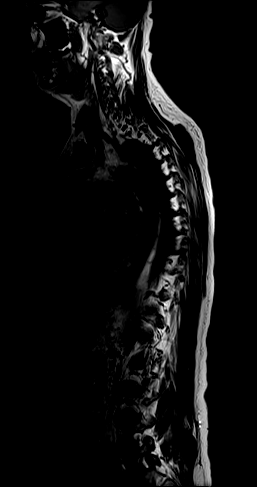
[im 9/9]
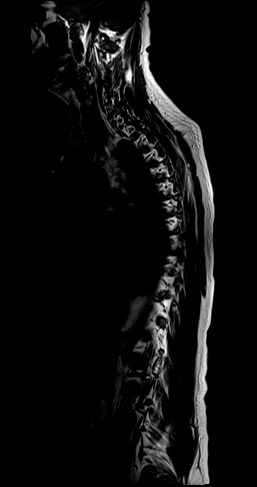

[Series 19: T2 · sagittal · 3.0mm · 1.06mm/px · 3 of 17 slices shown (1 of 4)]
[im 1/17]
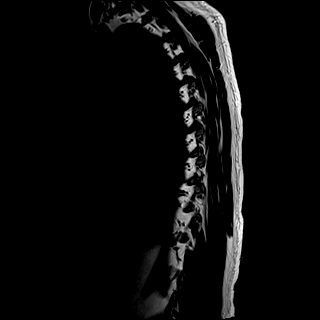
[im 9/17]
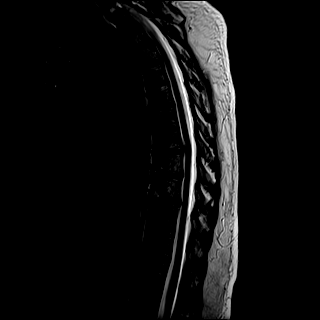
[im 17/17]
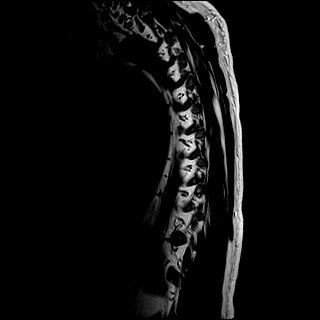

[Series 20: T1 · sagittal · 3.0mm · 1.06mm/px · 3 of 17 slices shown (2 of 4)]
[im 1/17]
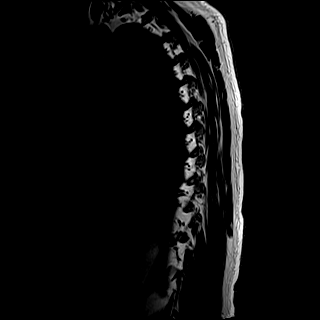
[im 9/17]
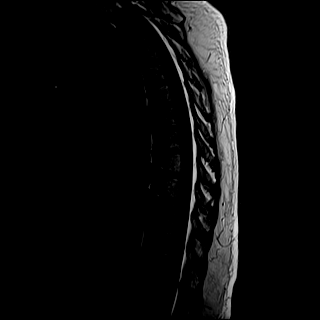
[im 17/17]
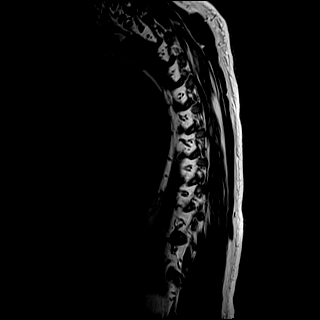

[Series 23: T2 · axial · 4.0mm · 0.59mm/px · z∈[-345,-93]mm · 7 of 39 slices shown (2 of 4)]
[im 1/39]
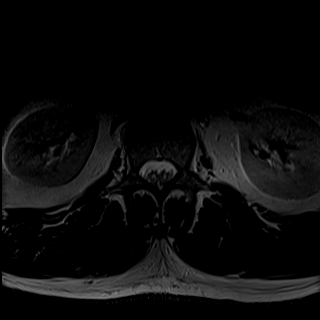
[im 7/39]
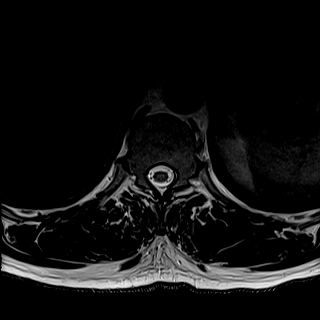
[im 13/39]
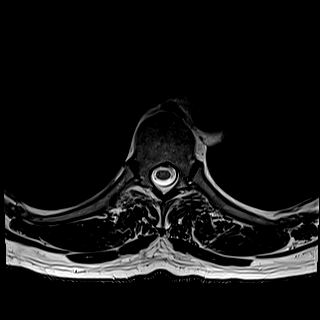
[im 20/39]
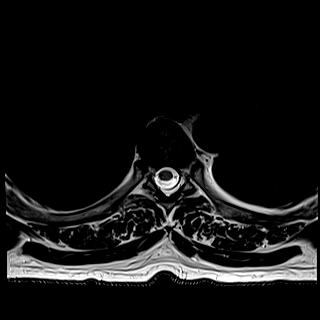
[im 26/39]
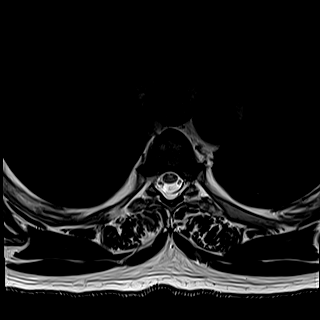
[im 32/39]
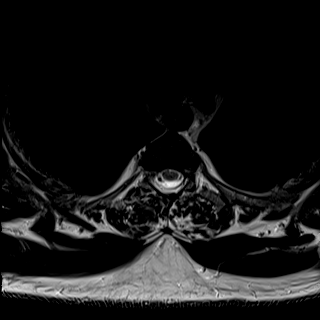
[im 39/39]
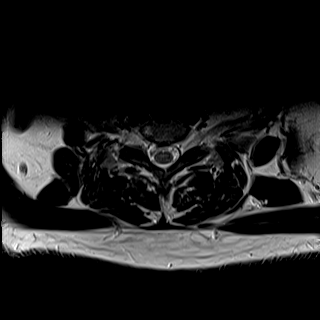

[Series 25: T2 · sagittal · 4.0mm · 0.81mm/px · 3 of 17 slices shown (3 of 4)]
[im 1/17]
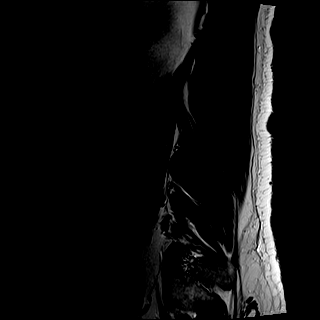
[im 9/17]
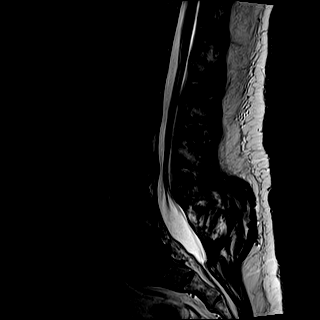
[im 17/17]
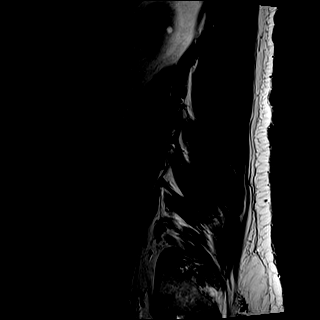

[Series 26: T1 · sagittal · 4.0mm · 0.81mm/px · 3 of 17 slices shown (3 of 4)]
[im 1/17]
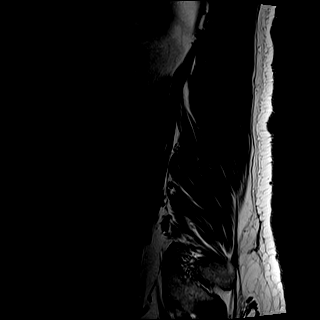
[im 9/17]
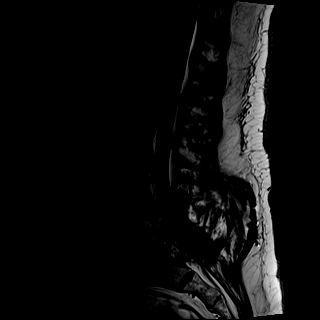
[im 17/17]
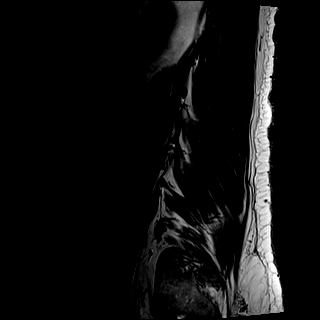

[Series 28: T2 · axial · 4.0mm · 0.78mm/px · z∈[-575,-343]mm · 7 of 39 slices shown (4 of 4)]
[im 1/39]
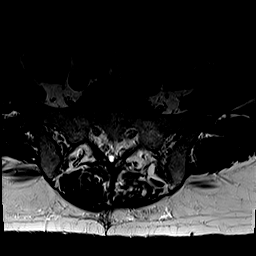
[im 7/39]
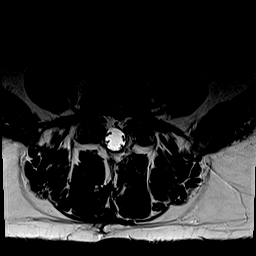
[im 13/39]
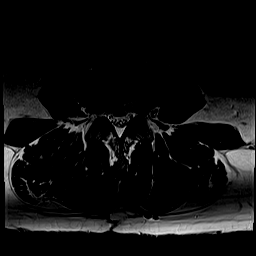
[im 20/39]
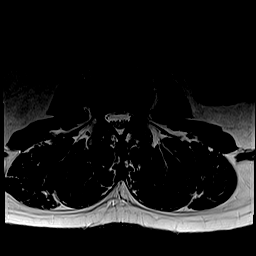
[im 26/39]
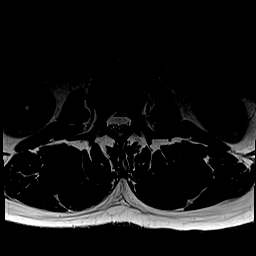
[im 32/39]
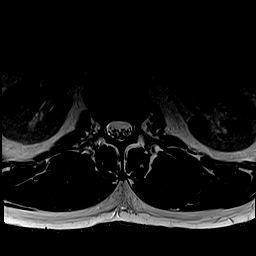
[im 39/39]
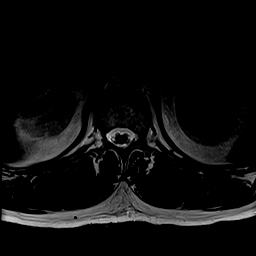

[Series 29: T1 · axial · 4.0mm · 0.39mm/px · z∈[-575,-377]mm · 6 of 39 slices shown (4 of 4)]
[im 1/39]
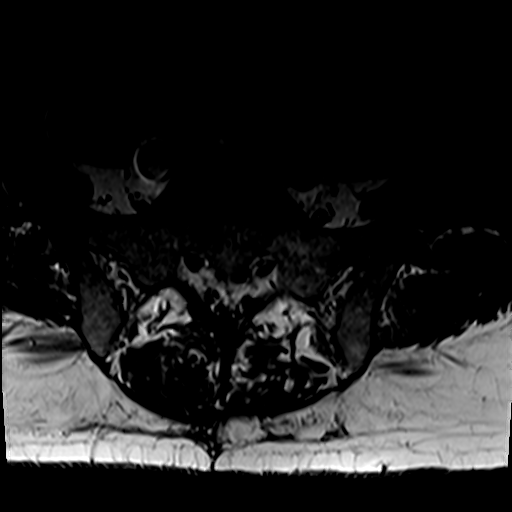
[im 7/39]
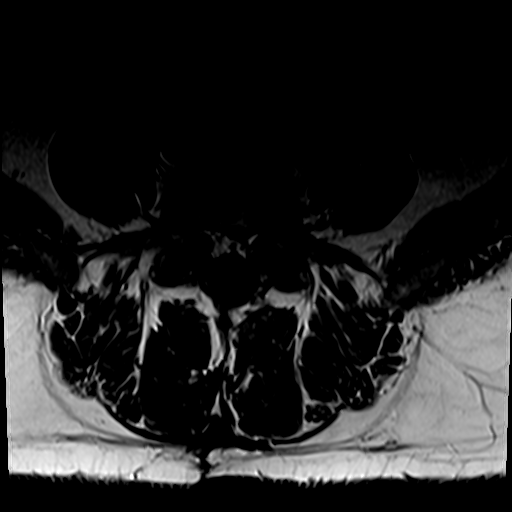
[im 13/39]
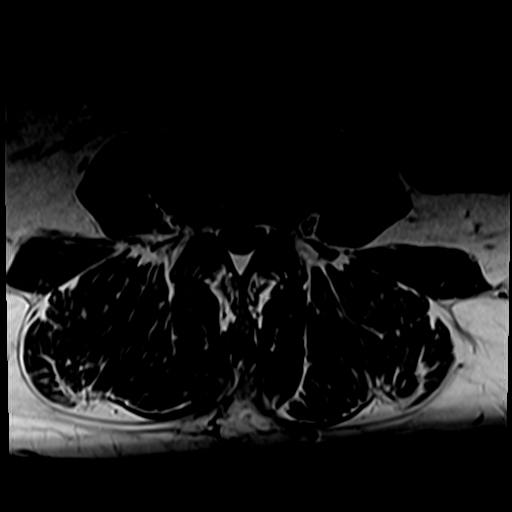
[im 20/39]
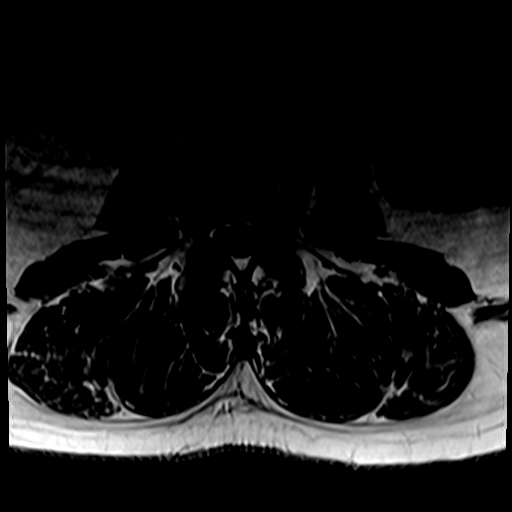
[im 26/39]
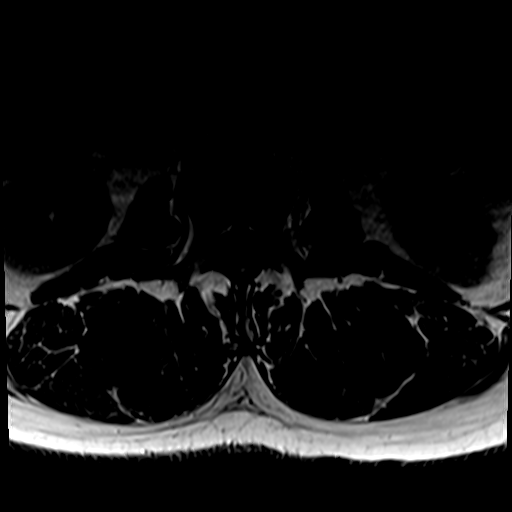
[im 32/39]
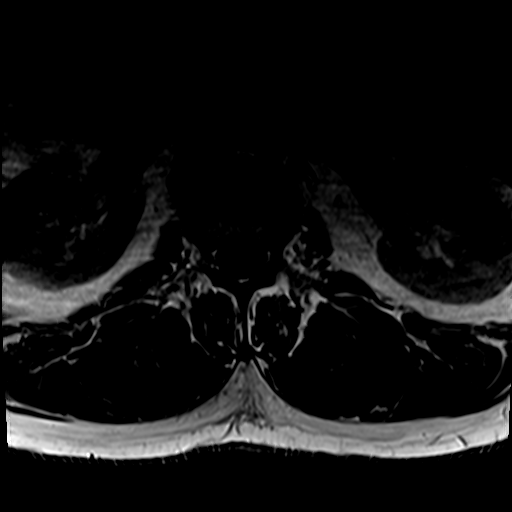

[34 of 48 positions shown; findings below may reference images not displayed]

FINDINGS: MRI THORACIC SPINE FINDINGS

Alignment:  No vertebral subluxation is observed.

Vertebrae: Disc desiccation at T7-8, T8-9, and T9-10. No significant
vertebral marrow edema is identified. Mild degenerative endplate
findings at T8-9 and T9-10.

Cord: Left anterior cord is indented at the T7-8 level due to
degenerative disc disease, but without abnormal internal cord
signal.

Paraspinal and other soft tissues: Prior median sternotomy.

Disc levels:

C7-T1: No impingement.  Right paracentral disc protrusion.

T1-2: Unremarkable.

T2-3: Unremarkable.

T3-4: No impingement.  Small left lateral recess disc protrusion.

T4-5: No impingement.  Small left lateral recess disc protrusion.

T5-6: Unremarkable.

T6-7: Unremarkable.

T7-8: Moderate left eccentric central narrowing of the thecal sac
due to a large focal disc protrusion. This indents the left anterior
cord although there is still some fluid CSF space posterior to the
cord. No abnormal cord signal. The disc protrusion extends mildly
cephalad.

T8-9: Mild central narrowing of the thecal sac due to a slightly
left paracentral disc protrusion.

T9-10: Mild central narrowing of the thecal sac due to a central
disc protrusion.

T10-11: Unremarkable.

T11-12: Unremarkable.

T12-L1: Unremarkable.

MRI LUMBAR SPINE FINDINGS

Segmentation: The lowest lumbar type non-rib-bearing vertebra is
labeled as L5.

Alignment:  No vertebral subluxation is observed.

Vertebrae: Prior posterior decompression at L4-5 at L5-S1. There is
some mild prominence the anterior epidural space at L5 and S1 with
stranding and mildly accentuated T2 signal in this epidural tissue
possibly a manifestation of mild fibrosis or mildly prominent
lymphatics.

Conus medullaris and cauda equina: Conus extends to the L1 level.
Conus and cauda equina appear normal.

Paraspinal and other soft tissues: Unremarkable

Disc levels:

L1-2: Unremarkable.

L2-3: No impingement.  Disc bulge with small central annular tear.

L3-4: Mild central narrowing of the thecal sac due to central disc
protrusion superimposed on disc bulge. The disc bulge abuts but does
not significantly displace the L3 nerves in the lateral
extraforaminal space.

L4-5: Borderline left subarticular lateral recess stenosis related
to a small left paracentral disc protrusion. Prior posterior
decompression.

L5-S1: Borderline right foraminal stenosis due to facet spurring.
Small central disc protrusion. Posterior decompression.
IMPRESSION: MR THORACIC SPINE IMPRESSION

1. Disc protrusions cause moderate impingement at T7-8 (with disc
protrusion indenting the left anterior cord) and mild impingement at
T8-9 and T9-10.

MR LUMBAR SPINE IMPRESSION

1. Mild central narrowing of the thecal sac at L3-4 due to disc
protrusion superimposed on disc bulge.
2. Only borderline levels of impingement remaining at the L4-5 and
L5-S1 levels where there has been posterior decompression. Slight
prominence of the anterior epidural adipose tissues at the L5 and S1
levels with stranding in the epidural adipose tissues which may be
from fibrosis or dilated lymphatics, but not causing impingement.
